# Patient Record
Sex: Male | Born: 1965 | Race: Black or African American | Hispanic: No | Marital: Married
Health system: Southern US, Community
[De-identification: ages and names within clinical notes are randomized; demographics above are authoritative.]

## PROBLEM LIST (undated history)

## (undated) DIAGNOSIS — J45909 Unspecified asthma, uncomplicated: Secondary | ICD-10-CM

## (undated) DIAGNOSIS — I10 Essential (primary) hypertension: Secondary | ICD-10-CM

## (undated) DIAGNOSIS — J479 Bronchiectasis, uncomplicated: Secondary | ICD-10-CM

---

## 2009-04-25 ENCOUNTER — Encounter: Payer: Self-pay | Admitting: Internal Medicine

## 2009-05-02 ENCOUNTER — Encounter: Payer: Self-pay | Admitting: Internal Medicine

## 2009-06-01 ENCOUNTER — Encounter: Admission: RE | Admit: 2009-06-01 | Discharge: 2009-06-01 | Payer: Self-pay | Admitting: Orthopedic Surgery

## 2009-06-02 ENCOUNTER — Encounter: Payer: Self-pay | Admitting: Internal Medicine

## 2009-08-08 ENCOUNTER — Ambulatory Visit: Payer: Self-pay | Admitting: Internal Medicine

## 2009-08-08 DIAGNOSIS — J45909 Unspecified asthma, uncomplicated: Secondary | ICD-10-CM | POA: Insufficient documentation

## 2009-08-12 DIAGNOSIS — I10 Essential (primary) hypertension: Secondary | ICD-10-CM | POA: Insufficient documentation

## 2009-09-01 ENCOUNTER — Ambulatory Visit: Payer: Self-pay | Admitting: Internal Medicine

## 2009-09-08 ENCOUNTER — Telehealth (INDEPENDENT_AMBULATORY_CARE_PROVIDER_SITE_OTHER): Payer: Self-pay | Admitting: *Deleted

## 2009-09-08 ENCOUNTER — Ambulatory Visit: Payer: Self-pay | Admitting: Internal Medicine

## 2009-10-09 ENCOUNTER — Ambulatory Visit: Payer: Self-pay

## 2009-10-09 ENCOUNTER — Encounter: Payer: Self-pay | Admitting: Internal Medicine

## 2009-10-09 ENCOUNTER — Ambulatory Visit (HOSPITAL_COMMUNITY): Admission: RE | Admit: 2009-10-09 | Discharge: 2009-10-09 | Payer: Self-pay | Admitting: Internal Medicine

## 2009-10-09 ENCOUNTER — Ambulatory Visit: Payer: Self-pay | Admitting: Internal Medicine

## 2009-10-16 ENCOUNTER — Telehealth (INDEPENDENT_AMBULATORY_CARE_PROVIDER_SITE_OTHER): Payer: Self-pay | Admitting: *Deleted

## 2009-11-07 ENCOUNTER — Ambulatory Visit: Payer: Self-pay | Admitting: Internal Medicine

## 2009-11-07 DIAGNOSIS — R05 Cough: Secondary | ICD-10-CM

## 2009-11-09 ENCOUNTER — Telehealth: Payer: Self-pay | Admitting: Internal Medicine

## 2009-11-10 ENCOUNTER — Telehealth (INDEPENDENT_AMBULATORY_CARE_PROVIDER_SITE_OTHER): Payer: Self-pay | Admitting: *Deleted

## 2009-11-10 ENCOUNTER — Ambulatory Visit (HOSPITAL_COMMUNITY): Admission: RE | Admit: 2009-11-10 | Discharge: 2009-11-10 | Payer: Self-pay | Admitting: Internal Medicine

## 2009-11-10 LAB — CONVERTED CEMR LAB
Basophils Absolute: 0 10*3/uL (ref 0.0–0.1)
CO2: 30 meq/L (ref 19–32)
Chloride: 105 meq/L (ref 96–112)
Eosinophils Absolute: 0.2 10*3/uL (ref 0.0–0.7)
HCT: 47.7 % (ref 39.0–52.0)
Lymphs Abs: 1.9 10*3/uL (ref 0.7–4.0)
MCHC: 34.4 g/dL (ref 30.0–36.0)
MCV: 97.4 fL (ref 78.0–100.0)
Monocytes Absolute: 0.6 10*3/uL (ref 0.1–1.0)
Sed Rate: 3 mm/hr (ref 0–22)
Sodium: 142 meq/L (ref 135–145)

## 2009-11-13 ENCOUNTER — Telehealth: Payer: Self-pay | Admitting: Internal Medicine

## 2009-11-13 LAB — CONVERTED CEMR LAB
ANA Titer 1: NEGATIVE
Anti Nuclear Antibody(ANA): POSITIVE — AB
IgE (Immunoglobulin E), Serum: 12.4 [IU]/mL

## 2009-11-15 ENCOUNTER — Telehealth: Payer: Self-pay | Admitting: Internal Medicine

## 2009-11-20 ENCOUNTER — Telehealth: Payer: Self-pay | Admitting: Internal Medicine

## 2009-11-27 ENCOUNTER — Telehealth (INDEPENDENT_AMBULATORY_CARE_PROVIDER_SITE_OTHER): Payer: Self-pay

## 2009-11-28 ENCOUNTER — Ambulatory Visit: Payer: Self-pay | Admitting: Internal Medicine

## 2009-12-15 ENCOUNTER — Telehealth (INDEPENDENT_AMBULATORY_CARE_PROVIDER_SITE_OTHER): Payer: Self-pay | Admitting: *Deleted

## 2010-04-03 NOTE — Progress Notes (Signed)
Summary: ipratropium and albuterol rx -  Phone Note Call from Patient   Caller: Patient Call For: young Summary of Call: need ipratropium bromide and albuterol sulfate [facility]# [phone] Initial call taken by: Rickard Patience,  November 15, 2009 1:20 PM  Follow-up for Phone Call        Ipratropium not on pt's med list.  ATC pt's home number to clarrify this message --  no answer and unable to leave message .  WCB  Crystal Jones RN  November 15, 2009 2:35 PM  Called, spoke with pt.  He states [facility] filled these meds for him previously.  Ipratropium is not on his med list.  Dr. Maple Hudson, pls advise if ok to fill these meds for pt.  Thanks!  Follow-up by: Gweneth Dimitri RN,  November 15, 2009 4:20 PM  Additional Follow-up for Phone Call Additional follow up Details #1::        I have changed the med list for his neb medication. Please send it  as requested. Additional Follow-up by: Waymon Budge MD,  November 17, 2009 9:05 AM    Additional Follow-up for Phone Call Additional follow up Details #2::    ATC patient, [facility] are multiple [facility] with none to match the phone number given in message.  NA.  which [facility] does this need to be sent to? Boone Master CNA/MA  November 17, 2009 9:20 AM   ATC pt again.  NA, no option to LM. Boone Master CNA/MA  November 17, 2009 5:11 PM   rx sent. Carron Curie CMA  November 20, 2009 11:34 AM   New/Updated Medications: IPRATROPIUM-ALBUTEROL 0.5-2.5 (3) MG/3ML SOLN (IPRATROPIUM-ALBUTEROL) 1 neb four times a day as needed Prescriptions: IPRATROPIUM-ALBUTEROL 0.5-2.5 (3) MG/3ML SOLN (IPRATROPIUM-ALBUTEROL) 1 neb four times a day as needed  #360 x 3   Entered by:   Carron Curie CMA   Authorized by:   Waymon Budge MD   Signed by:   Carron Curie CMA on 11/20/2009   Method used:   Faxed to ...       [facility])       [address] [address]       [place], [place]   [address]       Ph: [phone]       Fax: [phone]   RxID:   [id number] IPRATROPIUM-ALBUTEROL 0.5-2.5 (3) MG/3ML SOLN (IPRATROPIUM-ALBUTEROL) 1 neb four times a day as needed  #360 x 3   Entered by:   Waymon Budge MD   Authorized by:   Pulmonary Triage   Signed by:   Waymon Budge MD on 11/17/2009   Method used:   Historical   RxID:   [id number]

## 2010-04-03 NOTE — Progress Notes (Signed)
Summary: Prescription for nebulizer and albuterol  Phone Note Call from Patient Call back at Home Phone [phone]   Caller: Alejandro King Summary of Call: Patient wants prescription for hand-held nebulizer sent to [facility] [address] and prescription for albuterol sent to [facility] @ [phone].   Initial call taken by: Leonette Monarch,  December 15, 2009 9:13 AM  Follow-up for Phone Call        Done- Spoke with pt's mother and made aware that [facility] and [facility] order for [facility] machine was [facility] to [facility] [address]. Follow-up by: Vernie Murders,  December 15, 2009 10:17 AM    New/Updated Medications: * HAND HELD NEBULIZER W/ TUBING use as directed Prescriptions: HAND HELD NEBULIZER W/ TUBING use as directed  #1 x 0   Entered by:   Vernie Murders   Authorized by:   Waymon Budge MD   Signed by:   Vernie Murders on 12/15/2009   Method used:   Telephoned to ...       [facility]  [phone]* (retail)       [address]       [place], [place]  [address]       Ph: [phone]       Fax: [phone]   RxID:   [id number] IPRATROPIUM-ALBUTEROL 0.5-2.5 (3) MG/3ML SOLN (IPRATROPIUM-ALBUTEROL) 1 neb four times a day as needed  #360 x 3   Entered by:   Vernie Murders   Authorized by:   Waymon Budge MD   Signed by:   Vernie Murders on 12/15/2009   Method used:   Electronically to        [facility] ([facility])             , [place]         Ph: [phone]       Fax: [phone]   RxID:   [id number]

## 2010-04-03 NOTE — Assessment & Plan Note (Signed)
Summary: follow up visit/kcw   Primary Provider/Referring Provider:  Sinthusek/ [place]  CC:  follow up visit-cough still and wheezing alot; using nebulizer reg.; Dizzy and headaches lately.Marland Kitchen  History of Present Illness: September 08, 2009- ? Asthma........................................Marland Kitchenmother here She expresses question why he gives out of breath easily. He didn't like the nauseating aftertaste of xopenex. He likes the albuterol nebulizer which opens him  without overstimulating. He says symbicort 1 puff will open him up in between 11 and 15 minutes. 2 puffs makes him jittery. Woke once choking, but denies awarenes of reflux/ heart burn or postnasal drip. Some soreness across upperchest.  November 07, 2009- ?Asthma, cough, dyspnea.........................Marland Kitchenmother here Since last here, Echocardiogram WNL. Arrived, with onset cough as he was walking hallway to exam room. He started treatment with nebulizer he brought with him. He sometimes gets sharp pain across upper anterior chest into right axilla- how often this happens depends on how hard he coughs. He remembers CT chest with contrast in December in [place], but says the study was mislabelled for another patient?? We tried but never got it. . Gets pink/ red eyes, mucus around eyes, rough skin around his eyes,. Sometimes dizzy with hard coughing. He flatly denies any reflux. Went to [facility] ER 3 days ago- says nothing was done. Will wake from sleep coughing and wheezing. Cough becomes productive of clear gluey mucus.  Denies rash, swollen glands, hot joints, fever, purulence, blood.. Gets headaches from back of neck over vertex.   Asthma History    Asthma Control Assessment:    Age range: 12+ years    Symptoms: throughout the day    Nighttime Awakenings: 0-2/month    Interferes w/ normal activity: some limitations    SABA use (not for EIB): several times per day    Asthma Control Assessment: Very Poorly Controlled   Preventive  Screening-Counseling & Management  Alcohol-Tobacco     Smoking Status: never  Current Medications (verified): 1)  Diovan Hct 320-12.5 Mg Tabs (Valsartan-Hydrochlorothiazide) .... Take 1 By Mouth Once Daily 2)  Vicodin 5-500 Mg Tabs (Hydrocodone-Acetaminophen) .... Take As Directed As Needed For 2 Falls 3)  Albuterol Sulfate (2.5 Mg/2ml) 0.083% Nebu (Albuterol Sulfate) .... Use Qid As Needed 4)  Singulair 10 Mg Tabs (Montelukast Sodium) .Marland Kitchen.. 1 Daily 5)  Omeprazole 20 Mg Cpdr (Omeprazole) .Marland Kitchen.. 1 Daily Before Meal For Acid  Allergies (verified): No Known Drug Allergies  Past History:  Past Medical History: Last updated: 08/19/2009 Asthma Hypertension  Past Surgical History: Last updated: 19-Aug-2009 none  Family History: Last updated: 08-19-09 Father- died unknown coause Mother living  Social History: Last updated: 08/19/2009 Single; no children, living with mother Patient never smoked.  No ETOH or drugs  Risk Factors: Smoking Status: never (11/07/2009)  Review of Systems      See HPI       The patient complains of shortness of breath with activity, productive cough, non-productive cough, and chest pain.  The patient denies shortness of breath at rest, irregular heartbeats, acid heartburn, indigestion, loss of appetite, weight change, abdominal pain, difficulty swallowing, sore throat, tooth/dental problems, headaches, nasal congestion/difficulty breathing through nose, sneezing, itching, hand/feet swelling, and fever.         cough productive clear mucus  Vital Signs:  Patient profile:   45 year old male Height:      71 inches Weight:      264.25 pounds BMI:     36.99 O2 Sat:      97 %  on Room air Pulse rate:   72 / minute BP sitting:   130 / 80  (right arm) Cuff size:   large  Vitals Entered By: Reynaldo Minium CMA (November 07, 2009 11:02 AM)  O2 Flow:  Room air CC: follow up visit-cough still and wheezing alot; using nebulizer reg.; Dizzy and headaches  lately.   Physical Exam  Additional Exam:  General: A/Ox3; passive,cooperative. Room air sat 97%. Initially dramatic cough, using neb. As he finished he calmed and became conversational, with dry throat clearing. SKIN: no rash, lesions NODES: no lymphadenopathy HEENT: [place]/AT, EOM- WNL, Conjuctivae- clear, PERRLA, TM-WNL, Nose- clear, Throat- clear and wnl, Mallampati  III, no erythema, hoarseness, stridor or drainage. NECK: Supple w/ fair ROM, JVD- none, normal carotid impulses w/o bruits Thyroid- normal to palpation CHEST: Dry, paroxysmal  cough, shallow breathing, but no wheeze, rales, rub or dullness. Unlabored between coughs. HEART: RRR, no m/g/r heard ABDOMEN. Overweight WUJ:WJXB, nl pulses, no edema . NEURO: Grossly intact to observation,       Echocardiogram  Procedure date:  10/09/2009  Findings:         SONOGRAPHER  Junious Dresser      Erlinda Hong, Clinton D      PERFORMING   [facility]     cc:            --------------------------------------------------------------------     History:  PMH: Cough. Acquired from the patient and from the     patient's chart. Exertional dyspnea. Asthma. Risk factors:     Hypertension. Morbidly obese.            --------------------------------------------------------------------     Study Conclusions     Left ventricle: The cavity size was normal. Wall thickness was     normal. Systolic function was normal. The estimated ejection     fraction was in the range of 60% to 65%. Wall motion was normal;     there were no regional wall motion abnormalities.    Transthoracic     echocardiography. M-mode, complete 2D, spectral Doppler, and color     Doppler. Height: Height: 180.3cm. Height: 71in. Weight: Weight:     122.9kg. Weight: 270.4lb. Body mass index: BMI: 37.8kg/m 2. Body     surface area: BSA: 2.43m 2. Blood pressure: 140/70. Patient status:     Outpatient. Location: [facility]         Impression &  Recommendations:  Problem # 1:  ASTHMA (ICD-493.90) Question if this is asthma. 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 Query anxiety/ psych, reflux. He reports "pink eyes" with discharge, not apparent on exam today.  We can look for allerygy and for connective tissue problems possibly related to cough and intermittent conjunctival irritation.Marland Kitchen His mother is intensely concerned and insists there is something wrong. I will order CT with contrast, barium swallow, allergy profile, CBC, ANA, sed rate.  I currently favor some degree of upper airway irritation with a component of secondary gain that sends a grown man to living with his mother.  Other Orders: Est. Patient Level IV (14782) TLB-CBC Platelet - w/Differential (85025-CBCD) TLB-Sedimentation Rate (ESR) (85652-ESR) T-Angiotensin i-Converting Enzyme (95621-30865) T-Antinuclear Antib (ANA) [phone]) T-Allergy Profile Region II-[place], [place], [place], [place], [place] (5484) TLB-BMP (Basic Metabolic Panel-BMET) (80048-METABOL) Radiology Referral (Radiology)  Patient Instructions: 1)  Please schedule a follow-up appointment in  3 weeks. 2)  Lab 3)  See [facility] to schedule [facility] and Barium swallow 4)  when you come back, if you will bring any reports of tests you have in your files at home so we can review and include them in your record as appropriate.     Echocardiogram  Procedure date:  10/09/2009  Findings:         SONOGRAPHER  Junious Dresser      Erlinda Hong, Clinton D      PERFORMING   [facility]     cc:            --------------------------------------------------------------------     History:  PMH: Cough. Acquired from the patient and from the     patient's chart. Exertional dyspnea. Asthma. Risk factors:     Hypertension. Morbidly obese.            --------------------------------------------------------------------      Study Conclusions     Left ventricle: The cavity size was normal. Wall thickness was     normal. Systolic function was normal. The estimated ejection     fraction was in the range of 60% to 65%. Wall motion was normal;     there were no regional wall motion abnormalities.    Transthoracic     echocardiography. M-mode, complete 2D, spectral Doppler, and color     Doppler. Height: Height: 180.3cm. Height: 71in. Weight: Weight:     122.9kg. Weight: 270.4lb. Body mass index: BMI: 37.8kg/m 2. Body     surface area: BSA: 2.39m 2. Blood pressure: 140/70. Patient status:     Outpatient. Location: [facility]

## 2010-04-03 NOTE — Progress Notes (Signed)
  Phone Note Other Incoming   Request: Send information Summary of Call: Request for records received from [employer]. Request forwarded to [employer].     

## 2010-04-03 NOTE — Assessment & Plan Note (Signed)
Summary: rov / pft results///kp   Primary Provider/Referring Provider:  [place]/ [place]  CC:  Follow up visit-review PFTs..  History of Present Illness: History of Present Illness: 08-11-09- 43 yom came wwith his mother, concerned about episodic dyspnea. Never smoked and denies past hx of cardiopulmonary disease. New onset in Dec/ 2010 of variable dyspnea and cough noted every day to some degree. Might have begun as a cold, but onset unclear. He says 3 days before getting sick, apartment was sprayed ? for insects. Afer that  his "eyes had to be treated at ER"- implying irritiation.Cough productive white mucus. He first sought ER attention in January, then went to Dr Steele Berg, Pulmonary in [place]. Treated home neb being used several times daily with some relief. Tries to avoid symbicort and rescue inhaler- "shakes" Prednisone taper 07/21/09 from [facility] "brought up some cold". Denies GERD or seasonal rhinitis.  Today an episode began as they were coming here and he arived indicating dramatic dyspnea and cough, responsive to a neb treatment before I tried to get a hx. 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 Since being out of work, he has spent most of time indoors, especially in his bedroom. Feels "exhausted' without focal weakness, pain, edema or palpitation. Dyspneic with exertion walking in home. Denies depression. Larey Seat twice trying to rush to his nebulizer, injuring wrist and shoulder- now using cane. CXR 06/02/09- WNL; CT sinus 05/02/09- WNL; PFT- spirometry 04/25/09- submaximal effort with low volumes.  September 08, 2009- ? Asthma........................................Marland Kitchenmother here She expresses question why he gives out of breath easily. He didn't like the nauseating aftertaste of xopenex. He likes the albuterol nebulizer which opens him  without overstimulating. He  says symbicort 1 puff will open him up in between 11 and 15 minutes. 2 puffs makes him jittery. Woke once choking, but denies awarenes of reflux/ heart burn or postnasal drip. Some soreness across upperchest.   Asthma History    Asthma Control Assessment:    Age range: 12+ years    Symptoms: throughout the day    Nighttime Awakenings: 0-2/month    Interferes w/ normal activity: some limitations    SABA use (not for EIB): several times per day    Asthma Control Assessment: Very Poorly Controlled   Preventive Screening-Counseling & Management  Alcohol-Tobacco     Smoking Status: never  Current Medications (verified): 1)  Diovan Hct 320-12.5 Mg Tabs (Valsartan-Hydrochlorothiazide) .... Take 1 By Mouth Once Daily 2)  Vicodin 5-500 Mg Tabs (Hydrocodone-Acetaminophen) .... Take As Directed As Needed For 2 Falls 3)  Albuterol Sulfate (2.5 Mg/68ml) 0.083% Nebu (Albuterol Sulfate) .... Use Qid As Needed  Allergies (verified): No Known Drug Allergies  Past History:  Past Medical History: Last updated: Aug 11, 2009 Asthma Hypertension  Past Surgical History: Last updated: Aug 11, 2009 none  Family History: Last updated: 11-Aug-2009 Father- died unknown coause Mother living  Social History: Last updated: 2009-08-11 Single; no children, living with mother Patient never smoked.  No ETOH or drugs  Risk Factors: Smoking Status: never (09/08/2009)  Review of Systems      See HPI       The patient complains of shortness of breath with activity, shortness of breath at rest, and non-productive cough.  The patient denies productive cough, coughing up blood, chest pain, irregular heartbeats, acid heartburn, indigestion, loss of appetite, weight change, abdominal  pain, difficulty swallowing, sore throat, tooth/dental problems, headaches, nasal congestion/difficulty breathing through nose, and sneezing.    Vital Signs:  Patient profile:   45 year old male Height:      71 inches Weight:       271 pounds BMI:     37.93 O2 Sat:      94 % on Room air Pulse rate:   72 / minute BP sitting:   134 / 86  (right arm) Cuff size:   large  Vitals Entered By: Reynaldo Minium CMA (September 08, 2009 9:19 AM)  O2 Flow:  Room air CC: Follow up visit-review PFTs.   Physical Exam  Additional Exam:  General: A/Ox3; passive,cooperative. Unusual affect SKIN: no rash, lesions NODES: no lymphadenopathy HEENT: [place]/AT, EOM- WNL, Conjuctivae- clear, PERRLA, TM-WNL, Nose- clear, Throat- clear and wnl, Mallampati  III, no erythema, hoarseness, stridor or drainage. NECK: Supple w/ fair ROM, JVD- none, normal carotid impulses w/o bruits Thyroid- normal to palpation CHEST: Dry, paroxysmal  cough, shallow breathing, but no wheeze, rales, rub or dullness. Unlabored between coughs. HEART: RRR, no m/g/r heard ABDOMEN. Overweight JXB:JYNW, nl pulses, no edema . NEURO: Grossly intact to observation, quiet, responsive affect      Impression & Recommendations:  Problem # 1:  ASTHMA (ICD-493.90) PFT reviewed with him and mother. Limited expiratory volume controls spirometry flows, with improvement after using Xopenx at PFT lab. Marked improvement after bronchodilator could be real response but mayalso indicate better effort. His cough seems real, but more suggestive of reflux or ACE inhibitor. He is on an ARB.Marland Kitchen Mother is worried about his heart but I don''t see right sided signs and he isn't describing angina. palpitation. Having a 45 yo man who left work and mostly stays in his bedroom  in his mother's house is unusual. Mother said she was "angry" that nothing could be found as "an answer to this". 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 We will track down prior CT chest if done, get Echo, Give acid blocker, steroid inhaler and Singulair and let him  use his nebulizer.  Medications Added to Medication List This Visit: 1)  Singulair 10 Mg Tabs (Montelukast sodium) .Marland Kitchen.. 1 daily 2)  Omeprazole 20 Mg Cpdr (Omeprazole) .Marland Kitchen.. 1 daily before meal for acid  Other Orders: Est. Patient Level IV (29562) Echo Referral (Echo)  Patient Instructions: 1)  Please schedule a follow-up appointment in 1 month. 2)  We will contact office of Dr Truddie Crumble in [facility] for records including CT chest if done 3)  Script and sample- Singulair 1 daily 4)  Script for otc med omeprazpole, try 1 daily for a month as an acid blocker 5)  Sample steroid inhaler Qvar 80: 2 puffs and rise mouth, twice every day. 6)  A 2D Echocardiogram has been recommended.  Your imaging study may require preauthorization. Prescriptions: OMEPRAZOLE 20 MG CPDR (OMEPRAZOLE) 1 daily before meal for acid  #30 x prn   Entered and Authorized by:   Waymon Budge MD   Signed by:   Waymon Budge MD on 09/08/2009   Method used:   Print then Give to Patient   RxID:   [id number] SINGULAIR 10 MG TABS (MONTELUKAST SODIUM) 1 daily  #30 x prn   Entered and Authorized by:   Waymon Budge MD   Signed by:   Joni Fears  Magda Kiel MD on 09/08/2009   Method used:   Print then Give to Patient   RxID:   [id number]

## 2010-04-03 NOTE — Progress Notes (Signed)
Summary: results  Phone Note Call from Patient Call back at Home Phone [phone]   Caller: Patient Call For: young Summary of Call: results of echo.  Initial call taken by: Tivis Ringer, CNA,  October 16, 2009 12:24 PM  Follow-up for Phone Call        Advised pt of CY recs from Echo report. Per pt's last OV note will need follow up in 1 month. Now available spots will forward to [facility] to see where pt can be worked in. Tuesdays and Thursday work better for pt. Thanks. Zackery Barefoot CMA  October 16, 2009 12:34 PM   Additional Follow-up for Phone Call Additional follow up Details #1::        ROV scheduled for 11-07-09 at 11am.K[facility] CMA  October 16, 2009 1:49 PM

## 2010-04-03 NOTE — Letter (Signed)
Summary: [facility]  [facility]   Imported By: Sherian Rein 08/16/2009 08:04:26  _____________________________________________________________________  External Attachment:    Type:   Image     Comment:   External Document

## 2010-04-03 NOTE — Progress Notes (Signed)
  Phone Note Other Incoming   Request: Send information Summary of Call: Request for records received from [employer] Request forwarded to [employer].

## 2010-04-03 NOTE — Progress Notes (Signed)
Summary: letter for insurance  Phone Note Call from Patient Call back at Home Phone [phone]   Caller: Mom Call For: Florentina Addison Summary of Call: this msg per your conversation w/ pt's mom- ms. Aikens: need a letter faxed to ins co. (as previously done in july) stating "updates on pt's condition" as they haven't received anything since that date. mom's voicemail #  or [phone] ( home phone not set up for voicemail) Initial call taken by: Tivis Ringer, CNA,  November 09, 2009 4:15 PM  Follow-up for Phone Call        Spoke with CDY- states he doesnt recall paperwork but if pt will give [place] a number to call and get information as where to send letter he will do this. Reynaldo Minium CMA  November 10, 2009 3:19 PM   Called home number and NA, no option to leave a msg, called other number given and LMOMTCB Vernie Murders  November 10, 2009 3:33 PM   paperwork was received by Medical records on 11-10-09 and was forwarded to [employer]. Pt advised. Carron Curie CMA  November 13, 2009 10:53 AM

## 2010-04-03 NOTE — Assessment & Plan Note (Signed)
Summary: SOB- SELF REFERRAL///KP   Primary Provider/Referring Provider:  [place]/ [place]  CC:  New patient-SOB unexplained since 02/2009.  History of Present Illness: August 08, 2009- 43 yom came wwith his mother, concerned about episodic dyspnea. Never smoked and denies past hx of cardiopulmonary disease. New onset in Dec/ 2010 of variable dyspnea and cough noted every day to some degree. Might have begun as a cold, but onset unclear. He says 3 days before getting sick, apartment was sprayed ? for insects. Afer that  his "eyes had to be treated at ER"- implying irritiation.Cough productive white mucus. He first sought ER attention in January, then went to Dr Steele Berg, Pulmonary in [place]. Treated home neb being used several times daily with some relief. Tries to avoid symbicort and rescue inhaler- "shakes" Prednisone taper 07/21/09 from [facility] "brought up some cold". Denies GERD or seasonal rhinitis.  Today an episode began as they were coming here and he arived indicating dramatic dyspnea and cough, responsive to a neb treatment before I tried to get a hx. 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 Since being out of work, he has spent most of time indoors, especially in his bedroom. Feels "exhausted' without focal weakness, pain, edema or palpitation. Dyspneic with exertion walking in home. Denies depression. Larey Seat twice trying to rush to his nebulizer, injuring wrist and shoulder- now using cane. CXR 06/02/09- WNL; CT sinus 05/02/09- WNL; PFT- spirometry 04/25/09- submaximal effort with low volumes.  Asthma History    Initial Asthma Severity Rating:    Age range: 12+ years    Symptoms: daily    Nighttime Awakenings: 0-2/month    Interferes w/ normal activity: extreme limitations    SABA use (not for EIB): daily    Asthma Severity Assessment: Severe  Persistent   Preventive Screening-Counseling & Management  Alcohol-Tobacco     Smoking Status: never  Current Medications (verified): 1)  Diovan Hct 320-12.5 Mg Tabs (Valsartan-Hydrochlorothiazide) .... Take 1 By Mouth Once Daily 2)  Vicodin 5-500 Mg Tabs (Hydrocodone-Acetaminophen) .... Take As Directed As Needed For 2 Falls 3)  Albuterol Sulfate (2.5 Mg/5ml) 0.083% Nebu (Albuterol Sulfate) .... Use Qid As Needed  Allergies (verified): No Known Drug Allergies  Past History:  Past Medical History: Asthma Hypertension  Past Surgical History: none  Family History: Father- died unknown coause Mother living  Social History: Single; no children, living with mother Patient never smoked.  No ETOH or drugs Smoking Status:  never  Review of Systems      See HPI       The patient complains of shortness of breath with activity, productive cough, and non-productive cough.  The patient denies coughing up blood, irregular heartbeats, acid heartburn, indigestion, loss of appetite, weight change, abdominal pain, difficulty swallowing, sore throat, tooth/dental problems, headaches, nasal congestion/difficulty breathing through nose, sneezing, itching, ear ache, anxiety, depression, hand/feet swelling, joint stiffness or pain, rash, change in color of mucus, fever, shortness of breath at rest, and chest pain.    Vital Signs:  Patient profile:   45 year old male O2 Sat:      99 % on Room air Pulse rate:   75 / minute BP sitting:   112 / 70  (left arm) Cuff size:   large  Vitals Entered By: Reynaldo Minium CMA (August 08, 2009 3:56 PM)  O2 Flow:  Room  air  Physical Exam  Additional Exam:  General: A/Ox3; passive,cooperative. He arrived dramatically wheezing and coughing, calmed by initial Xopenex neb here. SKIN: no rash, lesions NODES: no lymphadenopathy HEENT: [place]/AT, EOM- WNL, Conjuctivae- clear, PERRLA, TM-WNL, Nose- clear, Throat- clear and wnl, Mallampati  III, no erythema,  hoarseness, stridor or drainage. NECK: Supple w/ fair ROM, JVD- none, normal carotid impulses w/o bruits Thyroid- normal to palpation CHEST: Dry cough, no wheeze after initial presentation. HEART: RRR, no m/g/r heard ABDOMEN: Soft and nl; nml bowel sounds; no organomegaly or masses noted. Overweight NWG:NFAO, nl pulses, no edema . Protective of right wrist and shoulder NEURO: Grossly intact to observation      Impression & Recommendations:  Problem # 1:  ASTHMA (ICD-493.90) This is somewhat atypical asthma setting. There is distinct suggetion of emotional overlay- grown man spending most of his time in bedroom, rather dramatic flinching from nasal speculum and protectiveness of right arm/ shoulder since he says he hurt ihimself in falls for which he was seen by orthopedist. Consider possible neuromuscular weakness. He says prednisone didnt help his breathing. Symbicort and a rescue inhaler didnt help, just made him "nervous". Simple spirometry in Feb showed inadequate effort. We will attempt new PFt, try less stimulating xopenex inhaler if available, and add a steroid maintenance inhaler. I discussed possibility of recurrent reflux and gave reflux precautions.  Medications Added to Medication List This Visit: 1)  Diovan Hct 320-12.5 Mg Tabs (Valsartan-hydrochlorothiazide) .... Take 1 by mouth once daily 2)  Vicodin 5-500 Mg Tabs (Hydrocodone-acetaminophen) .... Take as directed as needed for 2 falls 3)  Albuterol Sulfate (2.5 Mg/33ml) 0.083% Nebu (Albuterol sulfate) .... Use qid as needed 4)  Xopenex Hfa 45 Mcg/act Aero (Levalbuterol tartrate) .... 2 puffs qid as needed rescue inhaler  Other Orders: New Patient Level IV (13086)  Patient Instructions: 1)  Please schedule a follow-up appointment in 1 month. 2)  Sample/ script Xopenex rescue inhaler: 2 puffs, 4 x daily if needed 3)  Sample steroid inhaler Qvar 80: 2 puffs and rinse mouth, twice daily 4)  Schedule PFT 5)  Watch for any  sense that stomach juice is refluxing up into your throat to cause coughing spells Prescriptions: XOPENEX HFA 45 MCG/ACT AERO (LEVALBUTEROL TARTRATE) 2 puffs qid as needed rescue inhaler  #1 x prn   Entered and Authorized by:   Waymon Budge MD   Signed by:   Waymon Budge MD on 08/08/2009   Method used:   Print then Give to Patient   RxID:   [id number]

## 2010-04-03 NOTE — Assessment & Plan Note (Signed)
Summary: rov 3 weeks ///kp   Primary Provider/Referring Provider:  Randall Hiss  CC:  3 week follow up visit-still feeling bad;SOB and wheezing.Marland Kitchen  History of Present Illness:  November 07, 2009- ?Asthma, cough, dyspnea.........................Marland Kitchenmother here Since last here, Echocardiogram WNL. Arrived, with onset cough as he was walking hallway to exam room. He started treatment with nebulizer he brought with him. He sometimes gets sharp pain across upper anterior chest into right axilla- how often this happens depends on how hard he coughs. He remembers CT chest with contrast in December in [place], but says the study was mislabelled for another patient?? We tried but never got it. . Gets pink/ red eyes, mucus around eyes, rough skin around his eyes,. Sometimes dizzy with hard coughing. He flatly denies any reflux. Went to [facility] ER 3 days ago- says nothing was done. Will wake from sleep coughing and wheezing. Cough becomes productive of clear gluey mucus.  Denies rash, swollen glands, hot joints, fever, purulence, blood.. Gets headaches from back of neck over vertex.  November 28, 2009 ? Asthma, cough, dyspnea....................Marland Kitchenmother here Says he feels about the same and needed a neb before he got here. Says he went to ER [facility] 11/23/09 and was referred to his PCP without treatment. Mother reminds me of their account that symptoms began after exterminator had sprayed home. "everyone in the house went to the hospital" - irritated eyes, coughing. Mother needed a neb treatment. They said it wasn't from his job, because he had been working there a long time. Mother says each visit "its crazy, I don't understand it". Reviewed test results. CBC, BMP, ESR, ANA, ACE- all WNL/ Neg Allergy Profile- IgE 12.4; only elevated dust mites Barium swallow- no penetration or reflux seen CT chest- NL/ NEG and no  PE   Asthma History    Asthma Control Assessment:    Age range: 12+  years    Symptoms: throughout the day    Nighttime Awakenings: 0-2/month    Interferes w/ normal activity: some limitations    SABA use (not for EIB): several times per day    Asthma Control Assessment: Very Poorly Controlled   Preventive Screening-Counseling & Management  Alcohol-Tobacco     Smoking Status: never  Current Medications (verified): 1)  Diovan Hct 320-12.5 Mg Tabs (Valsartan-Hydrochlorothiazide) .... Take 1 By Mouth Once Daily 2)  Vicodin 5-500 Mg Tabs (Hydrocodone-Acetaminophen) .... Take As Directed As Needed For 2 Falls 3)  Ipratropium-Albuterol 0.5-2.5 (3) Mg/62ml Soln (Ipratropium-Albuterol) .Marland Kitchen.. 1 Neb Four Times A Day As Needed 4)  Singulair 10 Mg Tabs (Montelukast Sodium) .Marland Kitchen.. 1 Daily 5)  Omeprazole 20 Mg Cpdr (Omeprazole) .Marland Kitchen.. 1 Daily Before Meal For Acid  Allergies (verified): No Known Drug Allergies  Past History:  Past Medical History: Last updated: 08-21-09 Asthma Hypertension  Past Surgical History: Last updated: 2009-08-21 none  Family History: Last updated: 21-Aug-2009 Father- died unknown coause Mother living  Social History: Last updated: 08-21-2009 Single; no children, living with mother Patient never smoked.  No ETOH or drugs  Risk Factors: Smoking Status: never (11/28/2009)  Review of Systems      See HPI       The patient complains of non-productive cough.  The patient denies shortness of breath with activity, shortness of breath at rest, productive cough, coughing up blood, chest pain, irregular heartbeats, acid heartburn, indigestion, loss of appetite, weight change, abdominal pain, difficulty swallowing, sore throat, tooth/dental problems, headaches, nasal congestion/difficulty breathing through nose, sneezing, hand/feet swelling,  joint stiffness or pain, rash, change in color of mucus, and fever.         Wakes up with joint pains and hurting all over- new complaint.  Vital Signs:  Patient profile:   45 year old  male Height:      71 inches Weight:      260.13 pounds BMI:     36.41 O2 Sat:      100 % on Room air Pulse rate:   78 / minute BP sitting:   122 / 70  (left arm) Cuff size:   large  Vitals Entered By: Reynaldo Minium CMA (November 28, 2009 11:10 AM)  O2 Flow:  Room air CC: 3 week follow up visit-still feeling bad;SOB and wheezing.   Physical Exam  Additional Exam:  General: A/Ox3; passive,cooperative. Room air sat 100%. Initially dramatic cough, using neb. Marland Kitchen SKIN: no rash, lesions NODES: no lymphadenopathy HEENT: [place]/AT, EOM- WNL, Conjuctivae- clear, PERRLA, TM-WNL, Nose- clear, Throat- clear and wnl, Mallampati  III, no erythema, hoarseness, stridor or drainage. NECK: Supple w/ fair ROM, JVD- none, normal carotid impulses w/o bruits Thyroid- normal to palpation CHEST: Clear, unlabored and conversational. No cough at all while with me. HEART: RRR, no m/g/r heard ABDOMEN. Overweight ZOX:WRUE, nl pulses, no edema . NEURO: Grossly intact to observation,       Impression & Recommendations:  Problem # 1:  ASTHMA (ICD-493.90) I can't establish an objective abnormality. I don't associate this pattern with Diovan. Giving maximum benefit of a doubt, I would favor intermittent low grade reflux over some residual of pesticide exposure/ RADS, or a TDI sensitization from his former work . He and his mother attribute the complaint to insecticide exposure around Feb 09, 2009. I suggested methacholine challenge . He now states that he couldn't tolerate being off his own nebulizer long enough to perform the MCT. I offered another opinion and referral to [facility].  Other Orders: Est. Patient Level V (45409) Pulmonary Referral (Pulmonary)  Patient Instructions: 1)  Please schedule a follow-up appointment as needed. 2)  See [facility]  to schedule methacholine inhalation test ( cancelled by patient who says he can't be off nebulizer for this test) 3)                  and to schedule  referral for pulmonary medicine                 evaluation at [facility]

## 2010-04-03 NOTE — Progress Notes (Signed)
Summary: results  Phone Note Call from Patient   Caller: Patient Call For: YOUNG Summary of Call: pt returned call re: results. [phone] Initial call taken by: Tivis Ringer, CNA,  November 13, 2009 9:41 AM  Follow-up for Phone Call        pt was calling for lab results and ct result. I gave resutls per appends. Carron Curie CMA  November 13, 2009 10:52 AM

## 2010-04-03 NOTE — Progress Notes (Signed)
  Phone Note Other Incoming   Request: Send information Summary of Call: Request for records received from [employer] Request forwarded to [employer].     

## 2010-04-03 NOTE — Progress Notes (Signed)
Summary: PRESCRIPT  Phone Note Call from Patient      Additional Follow-up for Phone Call Additional follow up Details #2::    PT CALLING BACK ABOUT PRESCRIPT  THAT SHOULD HAVE BEEN CALLED TO [facility]. HE STATED THE PHONE NUMBER LISTED FOR [facility] IS THE CORRECT ONE IN PHONE NOTE   Additional Follow-up for Phone Call Additional follow up Details #3:: Details for Additional Follow-up Action Taken: Patient's mother Veva Holes called to inquire about medication being called in to [facility].  She stated that Claris Che was given the correct phone number.  Discussed with Philipp Deputy who said that prescription will be called in today.  Called patient's mother back at [phone] and informed her. Additional Follow-up by: Leonette Monarch,  November 20, 2009 11:29 AM  rx sent. Carron Curie CMA  November 20, 2009 11:47 AM

## 2010-04-03 NOTE — Miscellaneous (Signed)
Summary: Orders Update pft charges  Clinical Lists Changes  Orders: Added new Service order of Carbon Monoxide diffusing w/capacity (94720) - Signed Added new Service order of Lung Volumes (94240) - Signed Added new Service order of Spirometry (Pre & Post) (94060) - Signed 

## 2010-04-10 ENCOUNTER — Telehealth (INDEPENDENT_AMBULATORY_CARE_PROVIDER_SITE_OTHER): Payer: Self-pay | Admitting: *Deleted

## 2010-04-19 NOTE — Progress Notes (Signed)
  Phone Note Other Incoming   Request: Send information Summary of Call: Request for records received from [employer] Request forwarded to [employer].  12/02/2009 through present-Young

## 2010-09-21 IMAGING — CT CT ANGIO CHEST
3 of 7 series · 19 of 36 positions shown · IV contrast (omniscan)
Comparison: No similar prior study is available for comparison.

CLINICAL DATA: Shortness of breath, chest pain

CT ANGIOGRAPHY CHEST WITH CONTRAST
TECHNIQUE: Multidetector CT imaging of the chest was performed
using the standard protocol during bolus administration of
intravenous contrast.  Multiplanar CT image reconstructions
including MIPs were obtained to evaluate the vascular anatomy.
Contrast:  100 ml Omniscan 300 IV contrast

[Series 5: pulm embolism 3.0 b60f lung · axial · 0.65mm/px · z∈[-162,-39]mm · 3 of 83 slices shown]
[im 21/83  mediastinal]
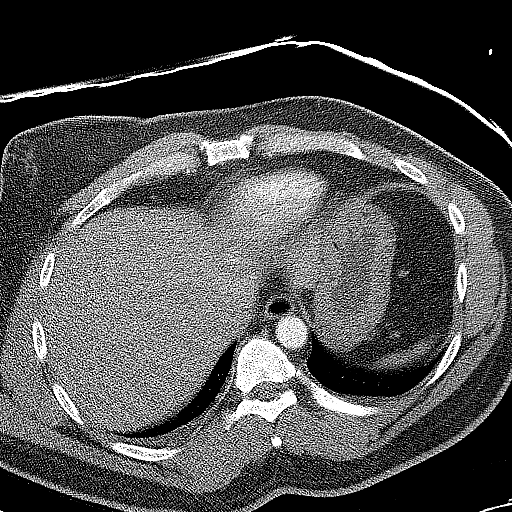
[im 42/83  mediastinal]
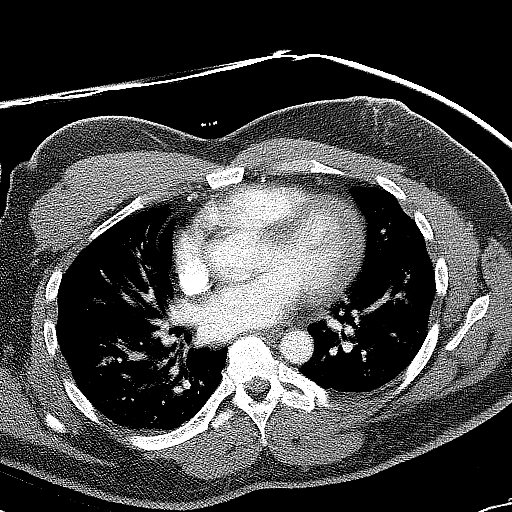
[im 62/83  mediastinal]
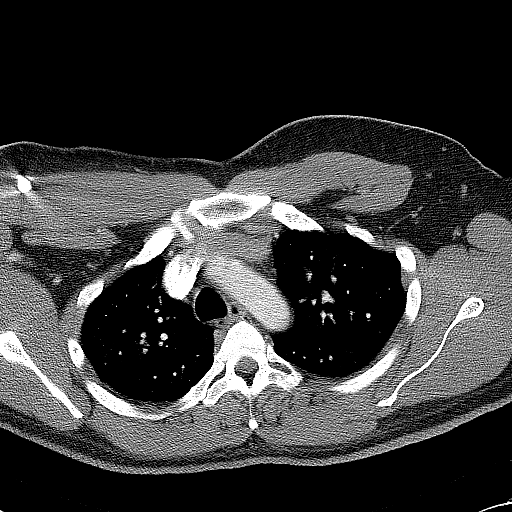

[Series 8: pulm embolism 1.0 b25f thins · axial · 0.65mm/px · z∈[-214,+2]mm · 15 of 249 slices shown]
[im 16/249  lung]
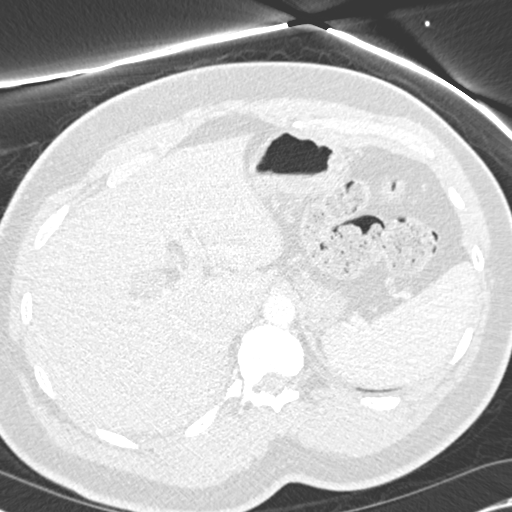
[im 32/249  mediastinal]
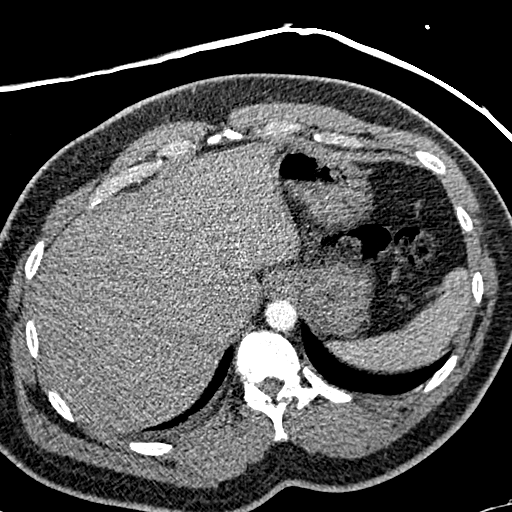
[im 47/249  lung]
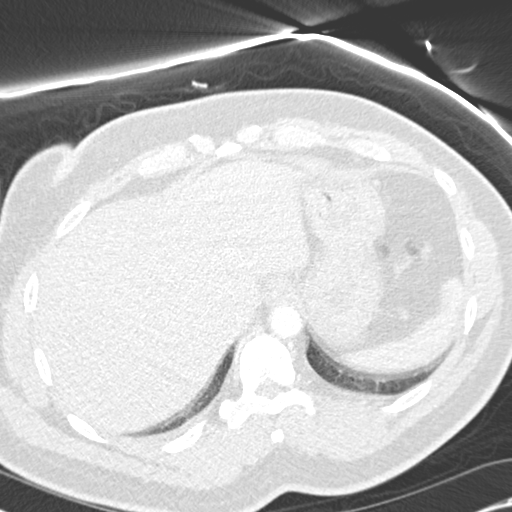
[im 63/249  mediastinal]
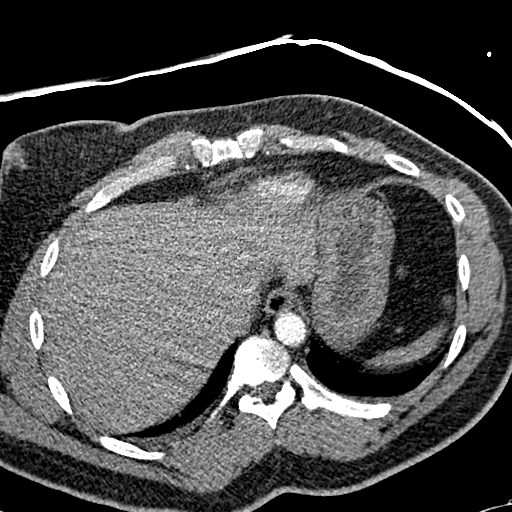
[im 78/249  lung]
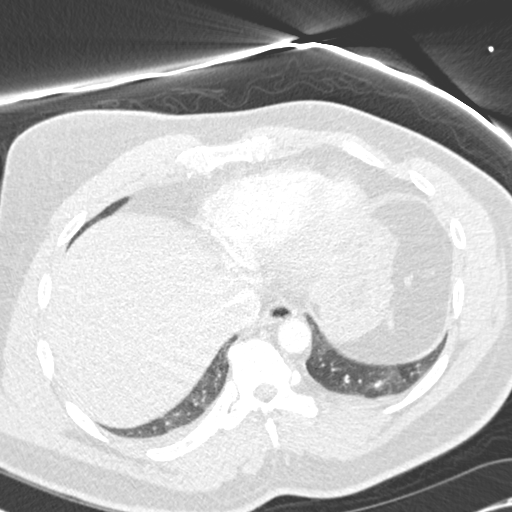
[im 94/249  mediastinal]
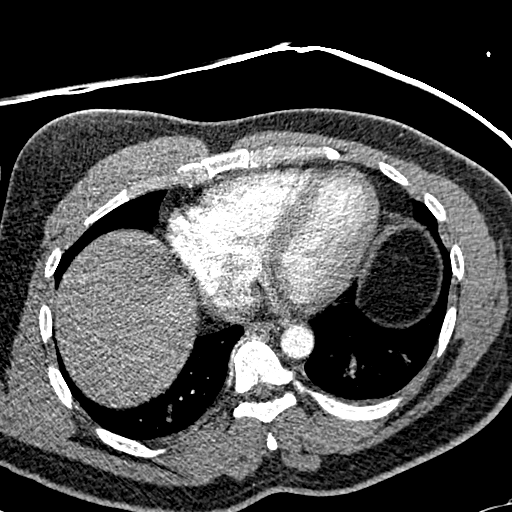
[im 109/249  lung]
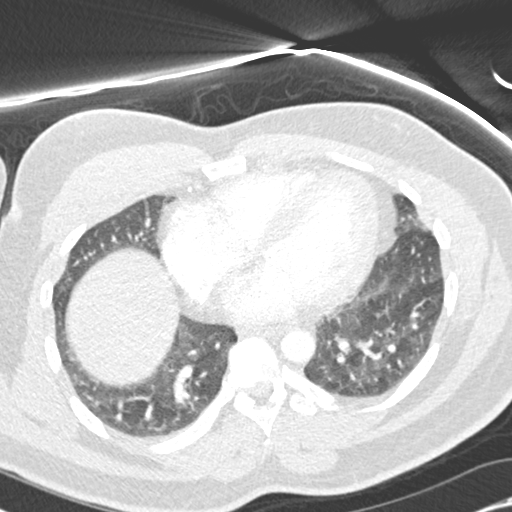
[im 125/249  mediastinal]
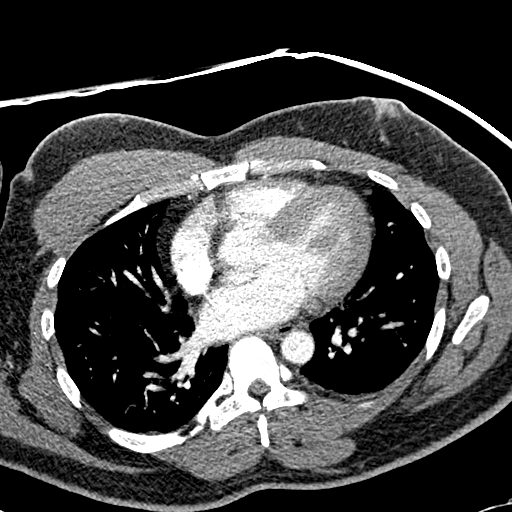
[im 140/249  lung]
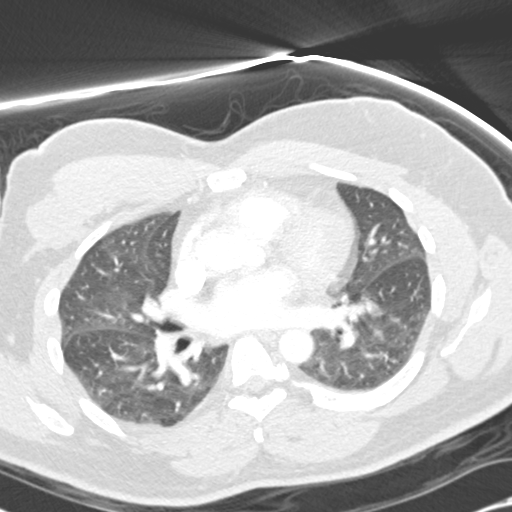
[im 156/249  mediastinal]
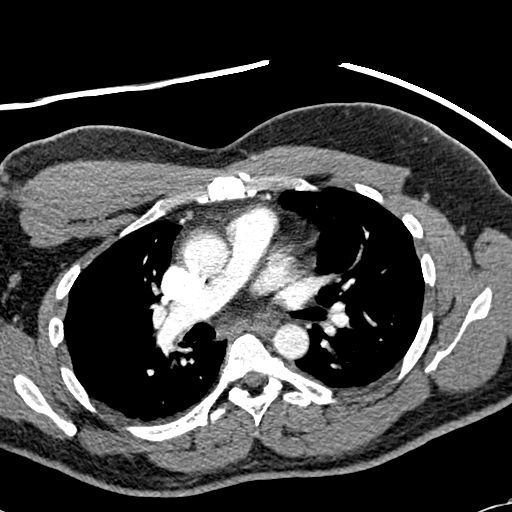
[im 171/249  lung]
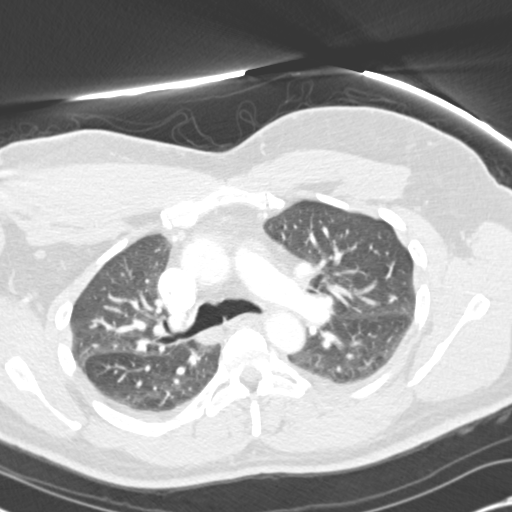
[im 187/249  mediastinal]
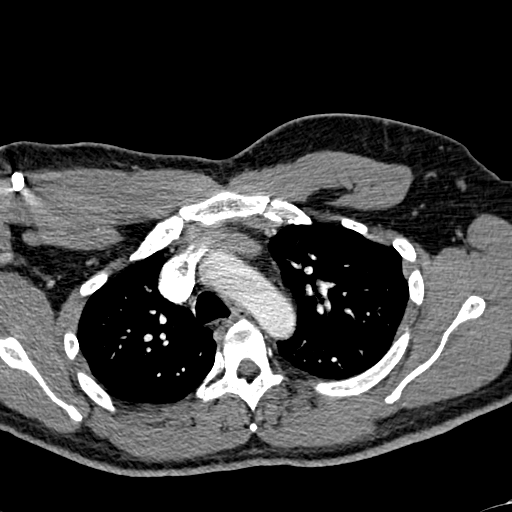
[im 202/249  lung]
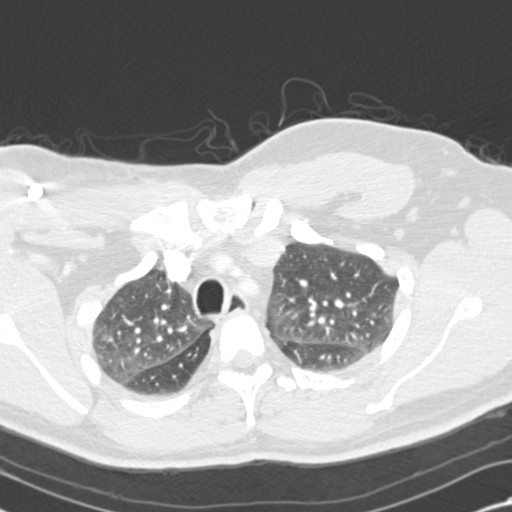
[im 218/249  mediastinal]
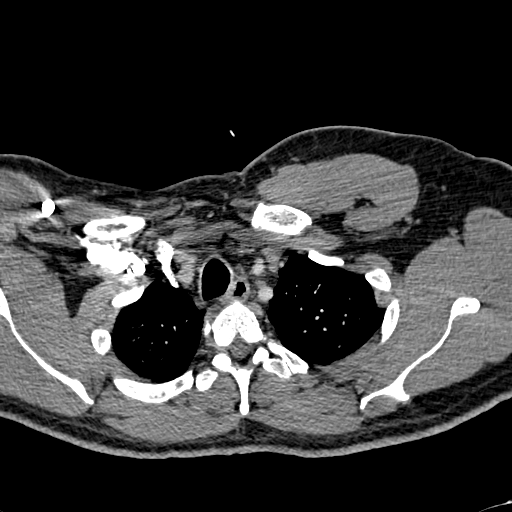
[im 233/249  lung]
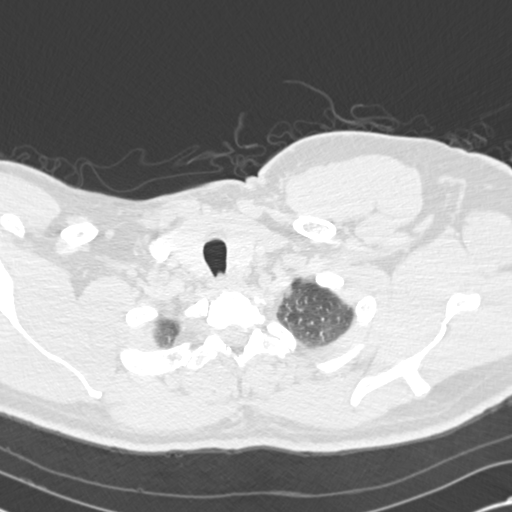

[Series 605: <mpr thick cor · coronal · 0.65mm/px · 1 of 94 slices shown]
[im 47/94  mediastinal]
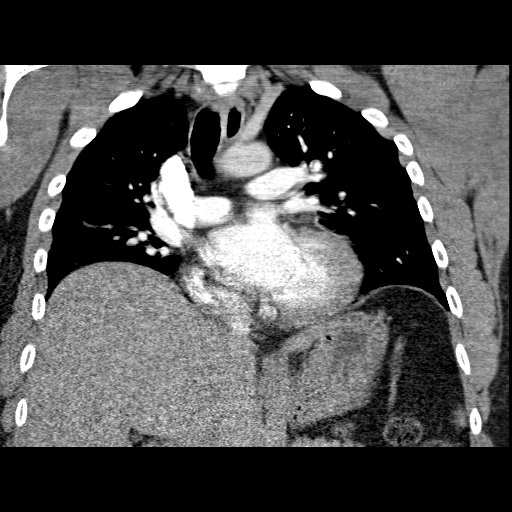

[19 of 36 positions shown; findings below may reference images not displayed]

FINDINGS: Heart size is normal.  Great vessels are normal in
caliber.  Examination is adequate for evaluation for pulmonary
embolism up to the 3rd order pulmonary arteries but more distal
evaluation is suboptimal due to early bolus timing.  No central
filling defect is seen to suggest acute pulmonary embolism.

No lymphadenopathy.  Trace bilateral effusions or pleural
thickening noted.  The lungs are clear.  No acute osseous
abnormality.

Review of the MIP images confirms the above findings.
IMPRESSION: No acute cardiopulmonary process.  Specifically, no pulmonary
embolism is identified.

## 2017-12-12 ENCOUNTER — Emergency Department (HOSPITAL_COMMUNITY): Payer: Medicare Other

## 2017-12-12 ENCOUNTER — Encounter (HOSPITAL_COMMUNITY): Payer: Self-pay

## 2017-12-12 ENCOUNTER — Emergency Department (HOSPITAL_COMMUNITY)
Admission: EM | Admit: 2017-12-12 | Discharge: 2017-12-12 | Disposition: A | Discharge status: Home / Self Care | Payer: Medicare Other | Attending: Emergency Medicine | Admitting: Emergency Medicine

## 2017-12-12 DIAGNOSIS — J45909 Unspecified asthma, uncomplicated: Secondary | ICD-10-CM | POA: Diagnosis not present

## 2017-12-12 DIAGNOSIS — J479 Bronchiectasis, uncomplicated: Secondary | ICD-10-CM | POA: Insufficient documentation

## 2017-12-12 DIAGNOSIS — I10 Essential (primary) hypertension: Secondary | ICD-10-CM | POA: Insufficient documentation

## 2017-12-12 DIAGNOSIS — R0789 Other chest pain: Secondary | ICD-10-CM | POA: Diagnosis present

## 2017-12-12 DIAGNOSIS — E230 Hypopituitarism: Secondary | ICD-10-CM

## 2017-12-12 DIAGNOSIS — E236 Other disorders of pituitary gland: Secondary | ICD-10-CM

## 2017-12-12 HISTORY — DX: Unspecified asthma, uncomplicated: J45.909

## 2017-12-12 HISTORY — DX: Bronchiectasis, uncomplicated: J47.9

## 2017-12-12 HISTORY — DX: Essential (primary) hypertension: I10

## 2017-12-12 LAB — BASIC METABOLIC PANEL
Anion gap: 6 (ref 5–15)
BUN: 12 mg/dL (ref 6–20)
CO2: 30 mmol/L (ref 22–32)
Calcium: 9 mg/dL (ref 8.9–10.3)
Chloride: 102 mmol/L (ref 98–111)
Creatinine, Ser: 1.25 mg/dL — ABNORMAL HIGH (ref 0.61–1.24)
GFR calc Af Amer: >60 mL/min (ref >60)
GFR calc non Af Amer: >60 mL/min (ref >60)
Glucose, Bld: 79 mg/dL (ref 70–99)
Potassium: 3.9 mmol/L (ref 3.5–5.1)
Sodium: 138 mmol/L (ref 135–145)

## 2017-12-12 LAB — CBC
HCT: 47.7 % (ref 39.0–52.0)
Hemoglobin: 15.8 g/dL (ref 13.0–17.0)
MCH: 32.2 pg (ref 26.0–34.0)
MCHC: 33.1 g/dL (ref 30.0–36.0)
MCV: 97.1 fL (ref 80.0–100.0)
Platelets: 227 10*3/uL (ref 150–400)
RBC: 4.91 MIL/uL (ref 4.22–5.81)
RDW: 13.4 % (ref 11.5–15.5)
WBC: 5.6 10*3/uL (ref 4.0–10.5)
nRBC: 0 % (ref 0.0–0.2)

## 2017-12-12 LAB — I-STAT TROPONIN, ED: Troponin i, poc: 0 ng/mL (ref 0.00–0.08)

## 2017-12-12 MED ORDER — CYCLOBENZAPRINE HCL 10 MG PO TABS: 10.0000 mg | ORAL_TABLET | Freq: Every evening | ORAL | 0 refills | Status: AC | PRN

## 2017-12-12 NOTE — Discharge Instructions (Signed)
Please read instructions below.  You can take flexeril at bedtime as needed for muscle spasm. Be aware this medication can make you drowsy. Do not drive or drink alcohol while taking it. Take tylenol every 4-6 hours as needed for pain. Follow up with your primary care/pulmonologist. A pulmonologist referral provided per your request. Return to the ER for new or worsening symptoms; including worsening chest pain, shortness of breath, pain that radiates to the arm or neck, pain or shortness of breath worsened with exertion.

## 2017-12-12 NOTE — ED Provider Notes (Signed)
[facility] EMERGENCY DEPARTMENT Provider Note   CSN: [id number] Arrival date & time: 12/12/17  1104     History   Chief Complaint Chief Complaint  Patient presents with  . Chest Pain    HPI Alejandro King is a 52 y.o. male with past medical history of bronchiectasis, Kallmann syndrome, hypertension, testosterone deficiency, presenting to the emergency department with 2 weeks of worsening right lateral chest pain.  He states last 2 days chest pain is gotten worse and is worse with coughing.  He states when he does his nebulizer treatments at home with him to cough and this makes his pain worse.  He states the pain comes down towards the end of the treatment though when he begins to cough again he has pain.  Localizes pain to the right mid axillary line and radiating around to his back.  Chest pain is not worse with exertion.  Not associated with nausea or diaphoresis.  States he gets increased gas and his cough around this time of year every year which he treats with steroids and a Z-Pak.  He states he did this treatment in September which provided him some improvement in his mucus production.  He is followed by [facility]ology at [facility] with last visit in August. Denies fever, congestion, sore throat, history of DVT/PE, hemoptysis, unilateral leg swelling or pain, recent travel or trauma.  The history is provided by the patient and medical records.    Past Medical History:  Diagnosis Date  . Asthma   . Bronchiectasis (HCC)   . Hypertension     Patient Active Problem List   Diagnosis Date Noted  . Bronchiectasis (HCC)   . Kallman syndrome (HCC)   . COUGH 11/07/2009  . HYPERTENSION 08/12/2009  . ASTHMA 08/08/2009    History reviewed. No pertinent surgical history.     Home Medications    Prior to Admission medications   Medication Sig Start Date End Date Taking? Authorizing Provider  cyclobenzaprine (FLEXERIL) 10 MG tablet Take 1 tablet (10 mg total) by  mouth at bedtime as needed for muscle spasms. 12/12/17   Robinson, [place] N, PA-C    Family History No family history on file.  Social History Social History   Tobacco Use  . Smoking status: Not on file  Substance Use Topics  . Alcohol use: Not on file  . Drug use: Not on file     Allergies   Fish allergy   Review of Systems Review of Systems  Constitutional: Positive for fatigue. Negative for diaphoresis and fever.  HENT: Negative for congestion and sore throat.   Respiratory: Positive for cough and shortness of breath.   Cardiovascular: Positive for chest pain. Negative for palpitations and leg swelling.  Gastrointestinal: Negative for abdominal pain and nausea.  All other systems reviewed and are negative.    Physical Exam Updated Vital Signs BP 120/75   Pulse 80   Temp 98.2 F (36.8 C) (Oral)   Resp 13   SpO2 90%   Physical Exam  Constitutional: He appears well-developed and well-nourished. He does not appear ill. No distress.  HENT:  Head: Normocephalic and atraumatic.  Mouth/Throat: Oropharynx is clear and moist.  Eyes: Conjunctivae are normal.  Neck: Normal range of motion. Neck supple. No tracheal deviation present.  Cardiovascular: Normal rate, regular rhythm and intact distal pulses.  Pulmonary/Chest: Effort normal and breath sounds normal. No respiratory distress. He exhibits tenderness (There is tenderness to the chest wall along the right midaxillary  line and inferior to the scapula.).  Abdominal: Soft. Bowel sounds are normal. He exhibits no distension. There is no tenderness.  Musculoskeletal: He exhibits no edema.  Neurological: He is alert.  Skin: Skin is warm.  Psychiatric: He has a normal mood and affect. His behavior is normal.  Nursing note and vitals reviewed.    ED Treatments / Results  Labs (all labs ordered are listed, but only abnormal results are displayed) Labs Reviewed  BASIC METABOLIC PANEL - Abnormal; Notable for the  following components:      Result Value   Creatinine, Ser 1.25 (*)    All other components within normal limits  CBC  I-STAT TROPONIN, ED    EKG EKG Interpretation  Date/Time:  Friday December 12 2017 11:19:19 EDT Ventricular Rate:  73 PR Interval:  156 QRS Duration: 80 QT Interval:  368 QTC Calculation: 405 R Axis:   115 Text Interpretation:  Normal sinus rhythm Right axis deviation Septal infarct , age undetermined T wave abnormality, consider inferior ischemia Abnormal ECG No old tracing to compare Confirmed by Raeford Razor [phone]) on 12/12/2017 11:57:12 AM   Radiology Dg Chest 2 View  Result Date: 12/12/2017 CLINICAL DATA:  Chest pain EXAM: CHEST - 2 VIEW COMPARISON:  Chest CT November 10, 2009 FINDINGS: There is no edema or consolidation. Heart size and pulmonary vascularity are normal. No adenopathy. No pneumothorax. No bone lesions. IMPRESSION: No edema or consolidation. Electronically Signed   By: Bretta Bang III M.D.   On: 12/12/2017 12:11    Procedures Procedures (including critical care time)  Medications Ordered in ED Medications - No data to display   Initial Impression / Assessment and Plan / ED Course  I have reviewed the triage vital signs and the nursing notes.  Pertinent labs & imaging results that were available during my care of the patient were reviewed by me and considered in my medical decision making (see chart for details).     Pt w PMHx bronchiectasis, presenting with right lateral chest wall pain with coughing. Likely musculoskeletal in nature, pain is reproducible with palpation. Lungs CTAB, normal work of breathing. VS normal. CXR neg. Labs wnl, Cr at baseline.  Trop neg. Low risk wells. Low suspicion for ACS. Will discharge with flexeril, symptomatic tx, and PCP/pulmonology f/u. Pt well-appearing, and agreeable to plan.  Pt discussed with Dr. Juleen China.  Discussed results, findings, treatment and follow up. Patient advised of return  precautions. Patient verbalized understanding and agreed with plan.   Final Clinical Impressions(s) / ED Diagnoses   Final diagnoses:  Chest wall pain    ED Discharge Orders         Ordered    cyclobenzaprine (FLEXERIL) 10 MG tablet  At bedtime PRN     12/12/17 1357           Robinson, [place] N, PA-C 12/12/17 1359    Raeford Razor, MD 12/12/17 2314

## 2017-12-12 NOTE — ED Triage Notes (Signed)
Pt presents for evaluation of R sided chest pain that he believes is related to his lung x2 weeks intermittently that worsened 2 days ago. States pain with deep breathing. Hx of asthma.

## 2017-12-12 NOTE — ED Notes (Signed)
Patient transported to X-ray 

## 2018-10-23 IMAGING — DX DG CHEST 2V
2 series · 2 of 2 positions shown · non-contrast
Comparison: Chest CT November 10, 2009

CLINICAL DATA: Chest pain

EXAM:
CHEST - 2 VIEW

[chest pa]
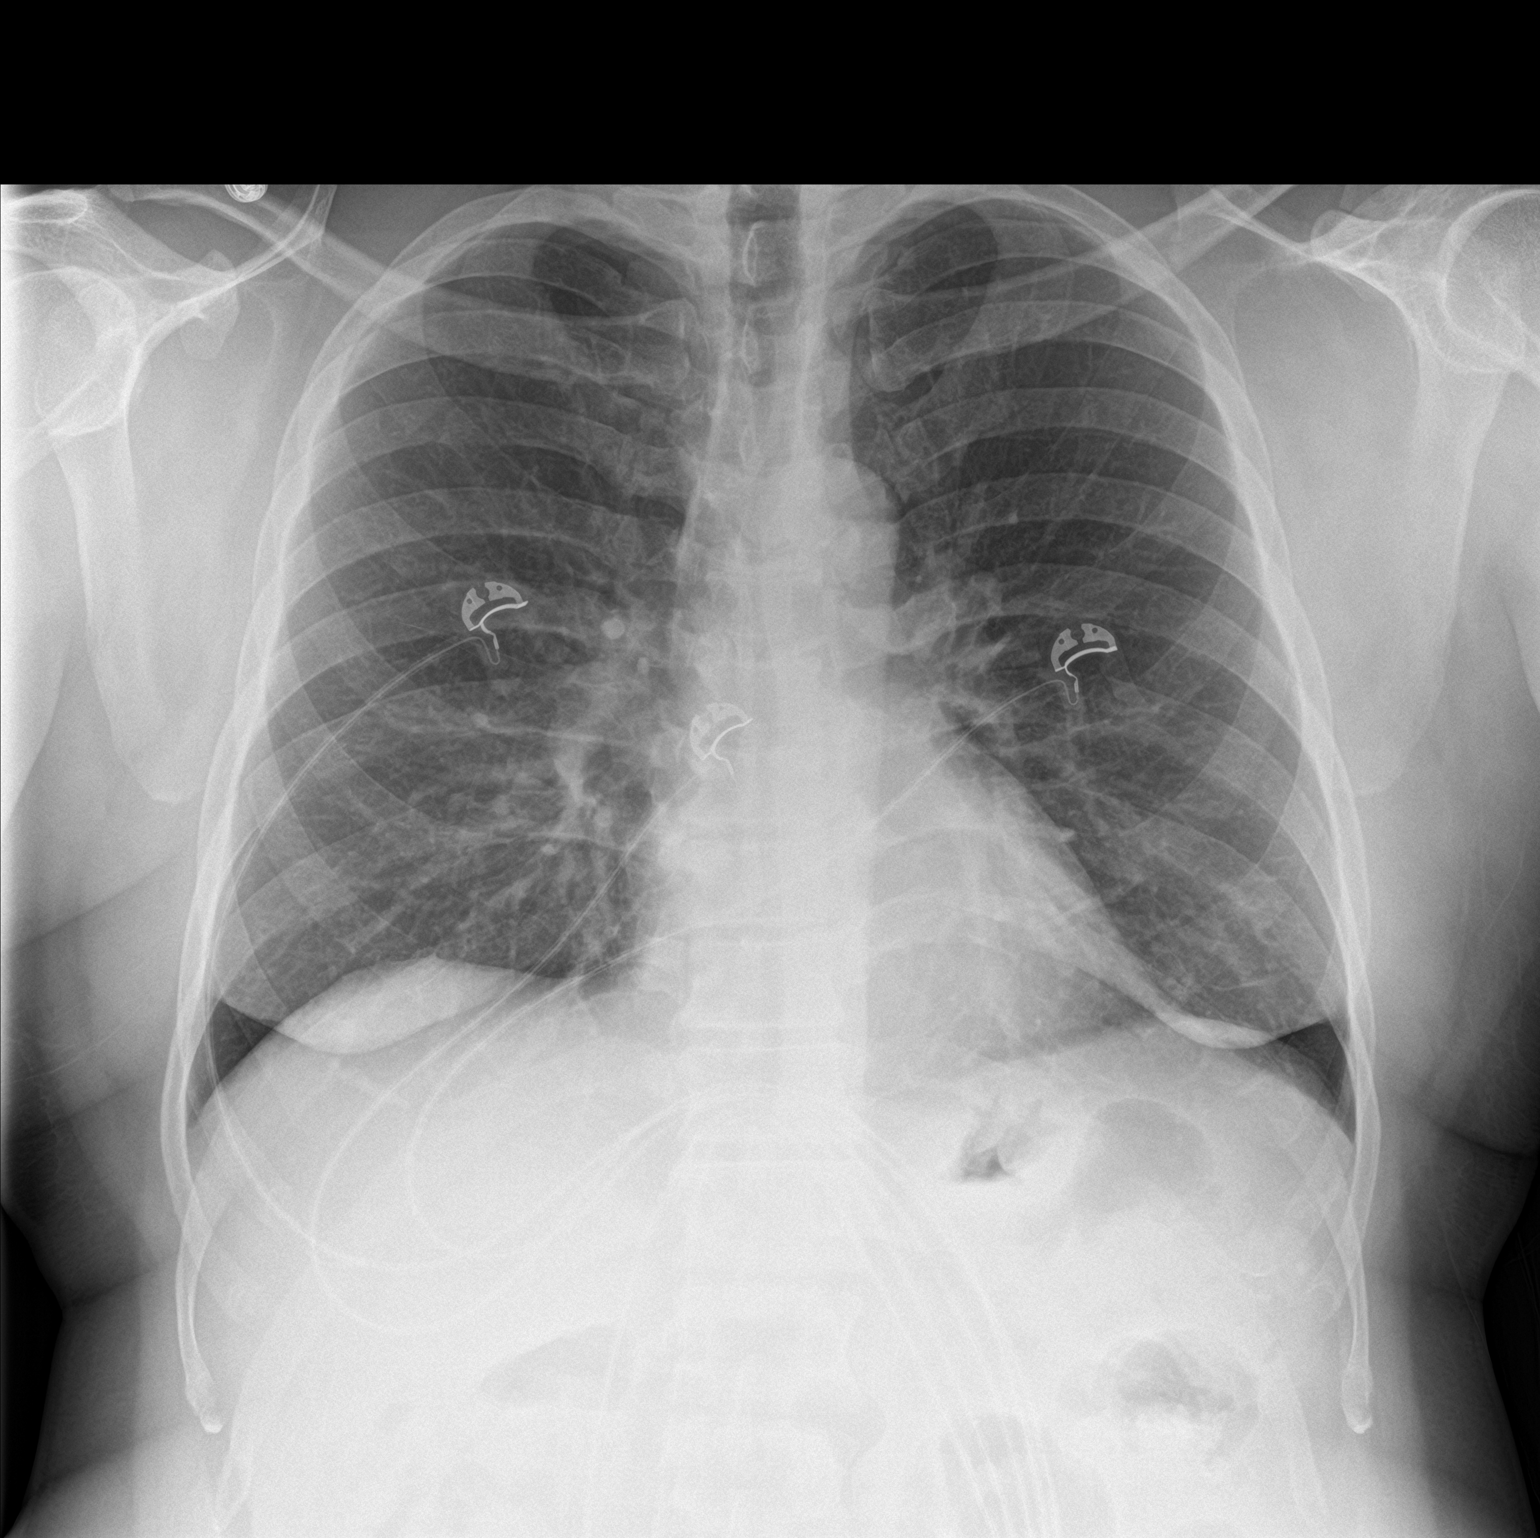

[chest lat]
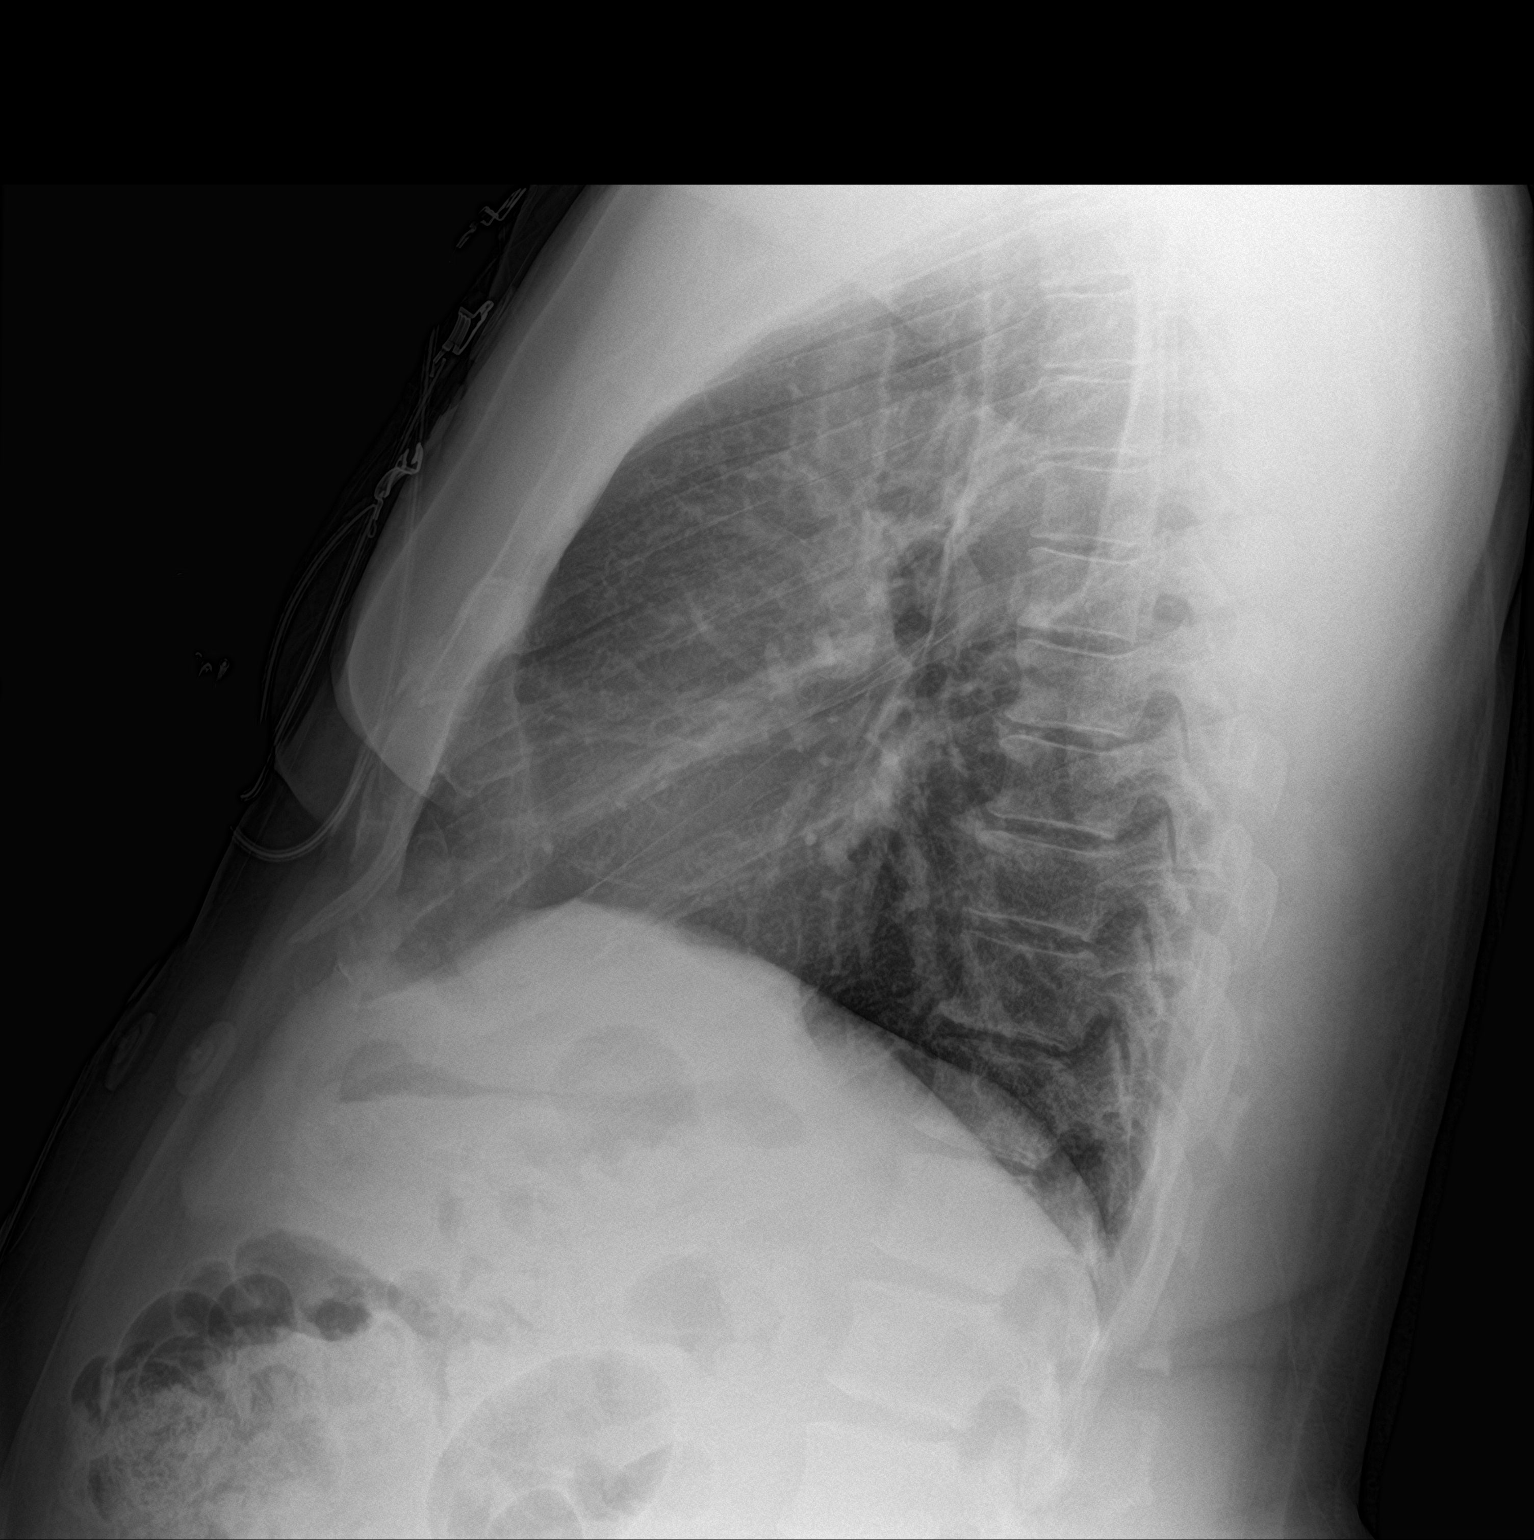

[2 of 2 positions shown; findings below may reference images not displayed]

FINDINGS: There is no edema or consolidation. Heart size and pulmonary
vascularity are normal. No adenopathy. No pneumothorax. No bone
lesions.
IMPRESSION: No edema or consolidation.
